# Patient Record
Sex: Male | Born: 1990 | Race: Black or African American | Hispanic: No | Marital: Single | Smoking: Current every day smoker
Health system: Southern US, Community
[De-identification: ages and names within clinical notes are randomized; demographics above are authoritative.]

---

## 2019-07-28 ENCOUNTER — Ambulatory Visit: Admit: 2019-07-28 | Payer: MEDICAID | Attending: Orthopedic Surgery | Primary: Family Medicine

## 2019-07-28 ENCOUNTER — Inpatient Hospital Stay: Admit: 2019-07-28 | Discharge: 2019-07-28 | Payer: MEDICAID | Primary: Family Medicine

## 2019-07-28 ENCOUNTER — Encounter: Admit: 2019-07-28 | Payer: PRIVATE HEALTH INSURANCE | Attending: Orthopedic Surgery | Primary: Family Medicine

## 2019-07-28 DIAGNOSIS — M25511 Pain in right shoulder: Secondary | ICD-10-CM

## 2019-07-28 NOTE — Progress Notes
Patient left after XR done without being seen. Will have scheduling contact patient to offer visit.Knox Saliva, MSN, APRN, FNP-BCDepartment of Orthopaedics & Rehabilitation - Sports MedicineY[facility]

## 2019-08-01 ENCOUNTER — Encounter: Admit: 2019-08-01 | Payer: PRIVATE HEALTH INSURANCE | Primary: Family Medicine

## 2019-08-02 ENCOUNTER — Encounter: Admit: 2019-08-02 | Payer: MEDICAID | Attending: Orthopedic Surgery | Primary: Family Medicine

## 2019-08-02 ENCOUNTER — Encounter: Admit: 2019-08-02 | Payer: PRIVATE HEALTH INSURANCE | Attending: Orthopedic Surgery | Primary: Family Medicine

## 2019-08-02 DIAGNOSIS — M25511 Pain in right shoulder: Secondary | ICD-10-CM

## 2019-08-02 DIAGNOSIS — Z8669 Personal history of other diseases of the nervous system and sense organs: Secondary | ICD-10-CM

## 2019-08-02 MED ORDER — BUTALBITAL-ACETAMINOPHEN-CAFFEINE 50 MG-300 MG-40 MG CAPSULE
50-300-40 mg | Status: AC
Start: 2019-08-02 — End: ?

## 2019-08-02 NOTE — Progress Notes
THIS VISIT WAS CONDUCTED VIA VIDEO VISIT DURING THE COVID-19 PANDEMICDate of Service: 3/2/2021Patient identity confirmed: YesPatient verbally consented to a Telehealth video visit: YesVIDEO TELEHEALTH VISIT VIDEO TELEHEALTH VISIT: This clinician is part of the telehealth program and is conducting this visit at a site within our enrolled clinical location. For this visit the clinician and patient were present via interactive audio & video telecommunications system that permits real-time communications. Patient Location: [place] clinician is appropriately licensed in the above state to provide care for this visit. Other individuals present during the telehealth encounter and their role/relation: None Total time spent in medical video consultation (required if billing based on time): 18 minutes The visit type for this patient required modifications due to the COVID-19 outbreak.Chief complaint/reason for visit: Right shoulder pain since Summer 2020.History and recent changes/symptoms or treatments: Karl Mendoza (RHD) states that he's had two injuries that have caused right shoulder pain. He states that in Sping/Summer 2020 he was playing basketball when he reached with the right arm and it felt like the shoulder stretched. He states that this caused mild soreness to the right shoulder, which improved with use of 800 mg Ibuprofen for a few days.In August/September 2020 he went to change a tire and the jack slipped and the tire fell on his shoulder and caused immediate pain. He states that since this injury, he's had significant pain to the shoulder when reaching overhead, behind the back, and when attempting to lift anything with the RUE. He feels that his RUE is weak. He states that he even gets occasional swelling to the RUE and into his right hand. He has taken 800 mg Ibuprofen PRN with minimal benefit. Karl Mendoza did have an MRI at [facility], which we do no currently have access to.Medications (90-Day Prescriptions given when Indicated) Patient's Medications New Prescriptions  No medications on file Previous Medications  BUTALBITAL-ACETAMINOPHEN-CAFFEINE 50-300-40 MG CAP    PLEASE SEE ATTACHED FOR DETAILED DIRECTIONS Modified Medications  No medications on file Discontinued Medications  No medications on file Imaging: X-ray imaging from 07/28/2019 of the right shoulder demonstrates no evidence of fracture, dislocation, or degenerative changes.Assessment and Plan: Karl Mendoza is a 29 y.o. male with pain/weakness to the right shoulder.I discussed with Karl Mendoza that I am going to attempt to get the MRI report and imaging sent over from [facility]. Once I do, I will call Karl Mendoza to discuss the results and the next steps in his plan of care.Karl Mendoza is in agreement with this plan. All of his questions were answered.Recommended Date of Follow-up: Follow-up pending MRI results.I have counseled about COVID19 ? specifically advising on social distancing, hand washing, staying home when sick. I gave them the [facility] hotline number if they have questions: [phone]-ASK-YNHH [phone] have reminded them of our office number should they need a more urgent appointment. Electronically Signed by Francena Hanly, APRN, August 02, 2019

## 2019-08-02 NOTE — Progress Notes
The following is the Review of systems done at intake and attached to this is the attending physician's documentation.Review of Systems Constitutional: Negative.  HENT: Negative.  Respiratory: Negative.  Cardiovascular: Negative.  Gastrointestinal: Negative.  Endocrine: Negative.  Genitourinary: Negative.  Musculoskeletal:      RT shoulder painRHD Allergic/Immunologic: Negative.  Hematological: Negative.  Psychiatric/Behavioral: Negative.

## 2019-08-04 ENCOUNTER — Telehealth: Admit: 2019-08-04 | Payer: PRIVATE HEALTH INSURANCE | Attending: Orthopedic Surgery | Primary: Family Medicine

## 2019-08-04 NOTE — Telephone Encounter
Called patient to inform him that I've called [facility] multiple times to request MRI and report, have been told it will be sent/faxed, and have not received it. Informed patient I will call again tomorrow. Patient verbalized understanding. All of her questions were answered.Knox Saliva, MSN, APRN, FNP-BCDepartment of Orthopaedics & Rehabilitation - [facility] MedicineY[facility]

## 2019-08-05 ENCOUNTER — Encounter: Admit: 2019-08-05 | Payer: PRIVATE HEALTH INSURANCE | Attending: Orthopedic Surgery | Primary: Family Medicine

## 2019-08-09 ENCOUNTER — Encounter: Admit: 2019-08-09 | Payer: PRIVATE HEALTH INSURANCE | Attending: Orthopedic Surgery | Primary: Family Medicine

## 2019-08-09 ENCOUNTER — Telehealth: Admit: 2019-08-09 | Payer: PRIVATE HEALTH INSURANCE | Attending: Orthopedic Surgery | Primary: Family Medicine

## 2019-08-09 MED ORDER — MELOXICAM 15 MG TABLET
15 mg | ORAL_TABLET | Freq: Every day | ORAL | 1 refills | Status: AC
Start: 2019-08-09 — End: ?

## 2019-08-09 NOTE — Telephone Encounter
Called patient and discussed MRI results. Informed patient that he does have a small rotator cuff tear, but it doesn't require surgery. Recommended 2 weeks of Meloxicam or CSI for pain in conjunction with PT. Patient states he did PT previously and it didn't help. Discussed with patient that PT may not have helped because of the amount of pain he was having and if we address that pain now PT could be much more beneficial. Patient would like to try Meloxicam with no PT at this time. Informed patient that prescription was sent to pharmacy and that patient is to take 1 tablet daily with food for 2 weeks. Informed patient to call or send MyChart message after 2 weeks of Meloxicam if he continues to have symptoms. Patient verbalized understanding. All of his questions were answered.Knox Saliva, MSN, APRN, FNP-BCDepartment of Orthopaedics & Rehabilitation - [facility] MedicineY[facility]

## 2019-09-04 ENCOUNTER — Encounter: Admit: 2019-09-04 | Payer: PRIVATE HEALTH INSURANCE | Attending: Family Medicine | Primary: Family Medicine

## 2020-10-07 ENCOUNTER — Encounter (HOSPITAL_COMMUNITY): Payer: Self-pay | Admitting: Emergency Medicine

## 2020-10-07 ENCOUNTER — Emergency Department (HOSPITAL_COMMUNITY): Payer: Self-pay

## 2020-10-07 ENCOUNTER — Emergency Department (HOSPITAL_COMMUNITY)
Admission: EM | Admit: 2020-10-07 | Discharge: 2020-10-07 | Disposition: A | Discharge status: Home / Self Care | Payer: Self-pay | Attending: Emergency Medicine | Admitting: Emergency Medicine

## 2020-10-07 DIAGNOSIS — L03115 Cellulitis of right lower limb: Secondary | ICD-10-CM | POA: Insufficient documentation

## 2020-10-07 DIAGNOSIS — Z23 Encounter for immunization: Secondary | ICD-10-CM | POA: Insufficient documentation

## 2020-10-07 DIAGNOSIS — B353 Tinea pedis: Secondary | ICD-10-CM

## 2020-10-07 MED ORDER — AMOXICILLIN-POT CLAVULANATE 875-125 MG PO TABS: 1.0000 | ORAL_TABLET | Freq: Two times a day (BID) | ORAL | 0 refills | Status: AC

## 2020-10-07 MED ORDER — TETANUS-DIPHTH-ACELL PERTUSSIS 5-2.5-18.5 LF-MCG/0.5 IM SUSY
0.5000 mL | PREFILLED_SYRINGE | Freq: Once | INTRAMUSCULAR | Status: AC
  Administered 2020-10-07: 0.5 mL via INTRAMUSCULAR
  Filled 2020-10-07: qty 0.5

## 2020-10-07 MED ORDER — TERBINAFINE HCL 1 % EX CREA: 1.0000 "application " | TOPICAL_CREAM | Freq: Two times a day (BID) | CUTANEOUS | 0 refills | Status: AC

## 2020-10-07 MED ORDER — AMOXICILLIN-POT CLAVULANATE 875-125 MG PO TABS
1.0000 | ORAL_TABLET | Freq: Once | ORAL | Status: AC
  Administered 2020-10-07: 1 via ORAL
  Filled 2020-10-07: qty 1

## 2020-10-07 NOTE — ED Provider Notes (Signed)
[facility]-EMERGENCY DEPT Provider Note   CSN: [id number] Arrival date & time: 10/07/20  1927     History Chief Complaint  Patient presents with  . Foot Pain    Espiridion Supinski is a 30 y.o. male.  The history is provided by the patient. No language interpreter was used.  Foot Pain     30 year old male who presents for evaluation of right foot pain.  Patient report for the past 2 to 3 days he has had progressive worsening pain about his right foot.  Described pain as a throbbing aching sensation moderate intensity with increased redness and swelling.  Increasing pain with ambulation.  Mildly improved when he soaked in Epsom salt.  No associated injury no fever chills no history of gout.  No history of diabetes.  Patient admits to having history of athlete's foot, currently not receiving treatment.  Unsure last tetanus status.  No past medical history on file.  There are no problems to display for this patient.   History reviewed. No pertinent surgical history.     No family history on file.     Home Medications Prior to Admission medications   Not on File    Allergies    Patient has no allergy information on record.  Review of Systems   Review of Systems  Constitutional: Negative for fever.  Musculoskeletal: Positive for joint swelling.  Skin: Negative for wound.    Physical Exam Updated Vital Signs BP (!) 135/92   Pulse 91   Temp 98.8 F (37.1 C) (Oral)   Resp 18   SpO2 98%   Physical Exam Vitals and nursing note reviewed.  Constitutional:      General: He is not in acute distress.    Appearance: He is well-developed.  HENT:     Head: Atraumatic.  Eyes:     Conjunctiva/sclera: Conjunctivae normal.  Musculoskeletal:        General: Tenderness (Right foot: Distal dorsum of foot is moderately erythematous, edematous, with pain to third toe.  Evidence of candidal infection in between the webspace.  Intact PT pulse) present.      Cervical back: Neck supple.  Skin:    Findings: No rash.  Neurological:     Mental Status: He is alert.     ED Results / Procedures / Treatments   Labs (all labs ordered are listed, but only abnormal results are displayed) Labs Reviewed - No data to display  EKG None  Radiology DG Foot Complete Right  Result Date: 10/07/2020 CLINICAL DATA:  Foot pain EXAM: RIGHT FOOT COMPLETE - 3+ VIEW COMPARISON:  None. FINDINGS: There is no evidence of fracture or dislocation. There is no evidence of arthropathy or other focal bone abnormality. Soft tissues are unremarkable. IMPRESSION: Negative. Electronically Signed   By: Deatra Robinson M.D.   On: 10/07/2020 20:08    Procedures Procedures   Medications Ordered in ED Medications  Tdap (BOOSTRIX) injection 0.5 mL (has no administration in time range)  amoxicillin-clavulanate (AUGMENTIN) 875-125 MG per tablet 1 tablet (has no administration in time range)    ED Course  I have reviewed the triage vital signs and the nursing notes.  Pertinent labs & imaging results that were available during my care of the patient were reviewed by me and considered in my medical decision making (see chart for details).    MDM Rules/Calculators/A&P  BP (!) 135/92   Pulse 91   Temp 98.8 F (37.1 C) (Oral)   Resp 18   SpO2 98%   Final Clinical Impression(s) / ED Diagnoses Final diagnoses:  Cellulitis of right foot    Rx / DC Orders ED Discharge Orders    None     7:48 PM Patient here with atraumatic right foot pain swelling and redness suggestive of cellulitis involving the foot.  He does have some macerated skin between his webspace of the toe from candidal infection likely contributing to his foot infection.  He report having toe fractures in the past therefore will obtain x-ray to rule out occult fracture.  Doubt DVT.  8:23 PM Xray R foot unremarkable.  Will treat with augmentin abx.  No obvious evidence of abscess at  this time.  Patient also has evidence of tenia pedis.  Will prescribe Lamisil as treatment    Fayrene Helper, PA-C 10/07/20 2052    Terald Sleeper, MD 10/07/20 [phone]

## 2020-10-07 NOTE — ED Triage Notes (Signed)
Patient reports right foot pain x3 days without injury. Reports pain worsens with movement.

## 2020-10-07 NOTE — Discharge Instructions (Addendum)
You have been diagnosed with infection of your right foot.  X-ray did not show any broken bone.  Please take antibiotic as prescribed.  Take over-the-counter Tylenol or ibuprofen as needed for pain.  There is evidence of foot fungus, apply Lamisil cream in between the webspace of your toes twice daily for the next 2 weeks.  Return if your condition worsen or if you have any concern.

## 2020-10-10 ENCOUNTER — Emergency Department (HOSPITAL_COMMUNITY)
Admission: EM | Admit: 2020-10-10 | Discharge: 2020-10-11 | Disposition: A | Discharge status: Home / Self Care | Payer: Medicaid Other | Attending: Emergency Medicine | Admitting: Emergency Medicine

## 2020-10-10 ENCOUNTER — Emergency Department (HOSPITAL_COMMUNITY): Payer: Medicaid Other

## 2020-10-10 ENCOUNTER — Encounter (HOSPITAL_COMMUNITY): Payer: Self-pay

## 2020-10-10 ENCOUNTER — Other Ambulatory Visit: Payer: Self-pay

## 2020-10-10 DIAGNOSIS — M79671 Pain in right foot: Secondary | ICD-10-CM | POA: Insufficient documentation

## 2020-10-10 LAB — CBC
HCT: 41.1 % (ref 39.0–52.0)
Hemoglobin: 14.1 g/dL (ref 13.0–17.0)
MCH: 30.7 pg (ref 26.0–34.0)
MCHC: 34.3 g/dL (ref 30.0–36.0)
MCV: 89.5 fL (ref 80.0–100.0)
Platelets: 263 10*3/uL (ref 150–400)
RBC: 4.59 MIL/uL (ref 4.22–5.81)
RDW: 14.1 % (ref 11.5–15.5)
WBC: 11.5 10*3/uL — ABNORMAL HIGH (ref 4.0–10.5)
nRBC: 0 % (ref 0.0–0.2)

## 2020-10-10 LAB — BASIC METABOLIC PANEL
Anion gap: 6 (ref 5–15)
BUN: 19 mg/dL (ref 6–20)
CO2: 26 mmol/L (ref 22–32)
Calcium: 9.8 mg/dL (ref 8.9–10.3)
Chloride: 104 mmol/L (ref 98–111)
Creatinine, Ser: 1.09 mg/dL (ref 0.61–1.24)
GFR, Estimated: >60 mL/min (ref >60)
Glucose, Bld: 99 mg/dL (ref 70–99)
Potassium: 3.8 mmol/L (ref 3.5–5.1)
Sodium: 136 mmol/L (ref 135–145)

## 2020-10-10 NOTE — ED Triage Notes (Signed)
Pt reports on going right foot pain since 5/5. Says he was here a few days ago and given cream for foot.

## 2020-10-10 NOTE — Discharge Instructions (Signed)
No obvious abnormality was found on your lab work-up or your CAT scan of your foot. Please keep foot elevated and avoid bearing weight. Please call Dr. Greig Right office tomorrow for recheck.

## 2020-10-10 NOTE — ED Provider Notes (Signed)
[facility]-EMERGENCY DEPT Provider Note   CSN: [id number] Arrival date & time: 10/10/20  2107     History Chief Complaint  Patient presents with  . Foot Pain    Paul Fischer is a 30 y.o. male.  HPI   30 year old male history of tinea seen here 3 days ago with some pain and swelling on the dorsal aspect of the right foot.  At that time he was thought to have cellulitis.  He was given a prescription for Augmentin.  He states he has been taking the Augmentin and was also given some antifungal cream.  He states the antifungal cream just worsens the situation.  He is having ongoing pain and swelling.  He has had no spreading redness, warmth, injury, or fever.  He has some chronic skin changes that he states have been present since he was in high school.  Reports no change in his amount of walking, standing, or shoes.  He is not a diabetic.  History reviewed. No pertinent past medical history.  There are no problems to display for this patient.   History reviewed. No pertinent surgical history.     No family history on file.  Social History   Tobacco Use  . Smoking status: Never Smoker  . Smokeless tobacco: Never Used  Substance Use Topics  . Alcohol use: Never  . Drug use: Never    Home Medications Prior to Admission medications   Medication Sig Start Date End Date Taking? Authorizing Provider  amoxicillin-clavulanate (AUGMENTIN) 875-125 MG tablet Take 1 tablet by mouth 2 (two) times daily. One po bid x 7 days 10/07/20   Fayrene Helper, PA-C  terbinafine (LAMISIL AT) 1 % cream Apply 1 application topically 2 (two) times daily. 10/07/20   Fayrene Helper, PA-C    Allergies    Patient has no known allergies.  Review of Systems   Review of Systems  All other systems reviewed and are negative.   Physical Exam Updated Vital Signs BP 116/63 (BP Location: Left Arm)   Pulse 72   Temp 98.1 F (36.7 C)   Resp 18   Ht 1.854 m (6\' 1" )   Wt 81.6 kg   SpO2  100%   BMI 23.75 kg/m   Physical Exam Vitals and nursing note reviewed.  Constitutional:      Appearance: He is well-developed.  HENT:     Head: Normocephalic and atraumatic.     Right Ear: External ear normal.     Left Ear: External ear normal.     Nose: Nose normal.  Neck:     Trachea: No tracheal deviation.  Pulmonary:     Effort: Pulmonary effort is normal.  Musculoskeletal:        General: Normal range of motion.     Comments: Bilateral feet with some chronic skin changes consistent with previous history of tinea pedis On the right foot there is some increased mild swelling on the dorsal aspect.  There is tenderness palpation between the second and fourth metatarsals.  There is no obvious fluctuance The plantar aspect of the foot is normal Pulses are intact and normal There is no obvious deformity or external signs of trauma  Skin:    General: Skin is warm and dry.  Neurological:     Mental Status: He is alert and oriented to person, place, and time.  Psychiatric:        Behavior: Behavior normal.     ED Results / Procedures / Treatments  Labs (all labs ordered are listed, but only abnormal results are displayed) Labs Reviewed  CBC - Abnormal; Notable for the following components:      Result Value   WBC 11.5 (*)    All other components within normal limits  BASIC METABOLIC PANEL    EKG None  Radiology No results found.  Procedures Procedures   Medications Ordered in ED Medications - No data to display  ED Course  I have reviewed the triage vital signs and the nursing notes.  Pertinent labs & imaging results that were available during my care of the patient were reviewed by me and considered in my medical decision making (see chart for details).    MDM Rules/Calculators/A&P                          Patient presents today with right foot pain.  Labs obtained with white blood cell count of 11,500 and remained within normal limits Patient had plain  films done on first visit.  Today he had CT obtained.  There is no evidence of acute abnormality noted on CT scan-specifically no evidence of fracture, osteo-, or abscess. Plan soft shoe and will refer to Ortho. Final Clinical Impression(s) / ED Diagnoses Final diagnoses:  Right foot pain    Rx / DC Orders ED Discharge Orders    None       Margarita Grizzle, MD 10/10/20 2309

## 2020-11-27 ENCOUNTER — Emergency Department (HOSPITAL_COMMUNITY)
Admission: EM | Admit: 2020-11-27 | Discharge: 2020-11-27 | Disposition: A | Discharge status: Home / Self Care | Payer: Self-pay | Attending: Emergency Medicine | Admitting: Emergency Medicine

## 2020-11-27 ENCOUNTER — Encounter (HOSPITAL_COMMUNITY): Payer: Self-pay

## 2020-11-27 ENCOUNTER — Emergency Department (HOSPITAL_COMMUNITY): Payer: Self-pay

## 2020-11-27 DIAGNOSIS — M546 Pain in thoracic spine: Secondary | ICD-10-CM | POA: Insufficient documentation

## 2020-11-27 DIAGNOSIS — M5431 Sciatica, right side: Secondary | ICD-10-CM | POA: Insufficient documentation

## 2020-11-27 DIAGNOSIS — M5432 Sciatica, left side: Secondary | ICD-10-CM | POA: Insufficient documentation

## 2020-11-27 MED ORDER — KETOROLAC TROMETHAMINE 60 MG/2ML IM SOLN
60.0000 mg | Freq: Once | INTRAMUSCULAR | Status: AC
  Administered 2020-11-27: 60 mg via INTRAMUSCULAR
  Filled 2020-11-27: qty 2

## 2020-11-27 MED ORDER — METHYLPREDNISOLONE 4 MG PO TBPK: ORAL_TABLET | ORAL | 0 refills | Status: DC

## 2020-11-27 MED ORDER — METHYLPREDNISOLONE 4 MG PO TBPK: ORAL_TABLET | ORAL | 0 refills | Status: AC

## 2020-11-27 MED ORDER — LIDOCAINE 5 % EX PTCH
1.0000 | MEDICATED_PATCH | CUTANEOUS | Status: DC
  Administered 2020-11-27: 1 via TRANSDERMAL
  Filled 2020-11-27: qty 1

## 2020-11-27 MED ORDER — IBUPROFEN 800 MG PO TABS: 800.0000 mg | ORAL_TABLET | Freq: Three times a day (TID) | ORAL | 0 refills | Status: DC

## 2020-11-27 MED ORDER — METHOCARBAMOL 500 MG PO TABS: 500.0000 mg | ORAL_TABLET | Freq: Two times a day (BID) | ORAL | 0 refills | Status: AC

## 2020-11-27 MED ORDER — METHOCARBAMOL 500 MG PO TABS: 500.0000 mg | ORAL_TABLET | Freq: Two times a day (BID) | ORAL | 0 refills | Status: DC

## 2020-11-27 NOTE — Discharge Instructions (Addendum)
Your back pain should be treated with medicines such as ibuprofen or aleve and this back pain should get better over the next 2 weeks.  You may also take the muscle relaxer, take at night do not drink or drive the medication.  Please see the handout for some low back stretches as well.  Take the course of steroids for your radiating symptoms.    However if you develop severe or worsening pain, low back pain with fever, numbness, weakness or inability to walk or urinate, you should return to the ER immediately.  Please follow up with your doctor this week for a recheck if still having symptoms.  Low back pain is discomfort in the lower back that may be due to injuries to muscles and ligaments around the spine.  Occasionally, it may be caused by a a problem to a part of the spine called a disc.  The pain may last several days or a week;  However, most patients get completely well in 4 weeks.

## 2020-11-27 NOTE — ED Provider Notes (Signed)
Emergency Medicine Provider Triage Evaluation Note  Paul Fischer , a 30 y.o. male  was evaluated in triage.  Pt complains of right-sided back pain.  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  He states that yesterday he started to feel some tightening in the right side of his back, which was not abnormal for him.  He states that as he was walking today, he coughed, and he had a severe muscle spasm to the right side of his back.  He also states that he feels more radiation into his legs now.  Denies any numbness.  No loss of bowel bladder control.  He has not taken anything for his symptoms.  Denies any IV drug use.  No fevers or chills.  No unintended weight loss.  No urinary symptoms.  Review of Systems  Positive: As above Negative: As above  Physical Exam  BP 128/75 (BP Location: Left Arm)   Pulse 81   Temp 98.2 F (36.8 C) (Oral)   Resp 20   SpO2 100%  Gen:   Awake, no distress   Resp:  Normal effort  MSK:   Moves extremities without difficulty , no midline tenderness to the C, T, L-spine, associated paraspinal right-sided thoracic and lumbar muscular tenderness Other:    Medical Decision Making  Medically screening exam initiated at 12:42 PM.  Appropriate orders placed.  Paul Fischer was informed that the remainder of the evaluation will be completed by another provider, this initial triage assessment does not replace that evaluation, and the importance of remaining in the ED until their evaluation is complete.     Mare Ferrari, PA-C 11/27/20 1244    Milagros Loll, MD 11/28/20 [phone]

## 2020-11-27 NOTE — ED Provider Notes (Signed)
[facility]   CSN: [id number] Arrival date & time: 11/27/20  1226     History Chief Complaint  Patient presents with   Back Pain    Paul Fischer is a 30 y.o. male.  HPI Pt complains of right-sided back pain.  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  He states that yesterday he started to feel some tightening in the right side of his back, which was not abnormal for him.  He states that as he was walking today, he coughed, and he had a severe muscle spasm to the right side of his back.  He also states that he feels more radiation into his legs now.  Denies any numbness.  No loss of bowel bladder control.  He has not taken anything for his symptoms.  Denies any IV drug use.  No fevers or chills.  No unintended weight loss.  No urinary symptoms.    History reviewed. No pertinent past medical history.  There are no problems to display for this patient.   History reviewed. No pertinent surgical history.     History reviewed. No pertinent family history.  Social History   Tobacco Use   Smoking status: Never   Smokeless tobacco: Never  Substance Use Topics   Alcohol use: Never   Drug use: Never    Home Medications Prior to Admission medications   Medication Sig Start Date End Date Taking? Authorizing Provider  amoxicillin-clavulanate (AUGMENTIN) 875-125 MG tablet Take 1 tablet by mouth 2 (two) times daily. One po bid x 7 days 10/07/20   Fayrene Helper, PA-C  ibuprofen (ADVIL) 800 MG tablet Take 1 tablet (800 mg total) by mouth 3 (three) times daily. 11/27/20   Mare Ferrari, PA-C  methocarbamol (ROBAXIN) 500 MG tablet Take 1 tablet (500 mg total) by mouth 2 (two) times daily. 11/27/20   Mare Ferrari, PA-C  methylPREDNISolone (MEDROL DOSEPAK) 4 MG TBPK tablet Take as directed until finished 11/27/20   Trudee Grip A, PA-C  terbinafine (LAMISIL AT) 1 % cream Apply 1 application  topically 2 (two) times daily. 10/07/20   Fayrene Helper, PA-C    Allergies    Patient has no known allergies.  Review of Systems   Review of Systems  Genitourinary:  Negative for dysuria and flank pain.  Musculoskeletal:  Positive for back pain.  Neurological:  Negative for weakness and numbness.   Physical Exam Updated Vital Signs BP 128/75 (BP Location: Left Arm)   Pulse 81   Temp 98.2 F (36.8 C) (Oral)   Resp 20   SpO2 100%   Physical Exam Vitals and nursing note reviewed.  Constitutional:      Appearance: He is well-developed.  HENT:     Head: Normocephalic and atraumatic.  Eyes:     Conjunctiva/sclera: Conjunctivae normal.  Cardiovascular:     Rate and Rhythm: Normal rate and regular rhythm.     Heart sounds: No murmur heard. Pulmonary:     Effort: Pulmonary effort is normal. No respiratory distress.     Breath sounds: Normal breath sounds.  Abdominal:     Palpations: Abdomen is soft.     Tenderness: There is no abdominal tenderness. There is no right CVA tenderness or left CVA tenderness.  Musculoskeletal:     Cervical back: Neck supple.     Comments: Mild midline tenderness of the thoracic spine, but mostly paraspinal musculature in the thoracic and  lumbar spine on the right.  Patient ambulating about the room with no evidence of foot drop.  5/5 strength in upper and lower extremities bilaterally.  No noticeable step-offs or crepitus.  No overlying erythema, warmth.  Skin:    General: Skin is warm and dry.  Neurological:     Mental Status: He is alert.    ED Results / Procedures / Treatments   Labs (all labs ordered are listed, but only abnormal results are displayed) Labs Reviewed - No data to display  EKG None  Radiology DG Thoracic Spine 2 View  Result Date: 11/27/2020 CLINICAL DATA:  Back pain after coughing EXAM: THORACIC SPINE 2 VIEWS COMPARISON:  None. FINDINGS: There is no evidence of thoracic spine fracture. Alignment is normal. No other  significant bone abnormalities are identified. IMPRESSION: Negative. Electronically Signed   By: Marlan Palau M.D.   On: 11/27/2020 14:59   DG Lumbar Spine Complete  Result Date: 11/27/2020 CLINICAL DATA:  Back pain EXAM: LUMBAR SPINE - COMPLETE 4+ VIEW COMPARISON:  None. FINDINGS: Five non rib-bearing lumbar type vertebra. Lumbar alignment within normal limits. Vertebral body heights are maintained. The disc spaces are patent. IMPRESSION: Negative. Electronically Signed   By: Jasmine Pang M.D.   On: 11/27/2020 14:59    Procedures Procedures   Medications Ordered in ED Medications  lidocaine (LIDODERM) 5 % 1 patch (1 patch Transdermal Patch Applied 11/27/20 1404)  ketorolac (TORADOL) injection 60 mg (60 mg Intramuscular Given 11/27/20 1406)    ED Course  I have reviewed the triage vital signs and the nursing notes.  Pertinent labs & imaging results that were available during my care of the patient were reviewed by me and considered in my medical decision making (see chart for details).    MDM Rules/Calculators/A&P                           Normal neurological exam, no evidence of urinary incontinence or retention, pain is consistently reproducible. There is no evidence of AAA or concern for dissection at this time.  Plain films of the thoracic and lumbar spine without abnormality.  Patient can walk but states is painful.  No loss of bowel or bladder control.  No concern for cauda equina.  No fever, night sweats, weight loss, h/o cancer, IVDU.  Pain treated here in the department with adequate improvement. RICE protocol and pain medicine indicated and discussed with patient.  Patient was provided with muscle relaxer, instructed to not drink or drive the medication due to sedating side effects.  We will also prescribe Medrol Dosepak as the patient does have some sciatica type symptoms.  Will refer to [facility] wellness as he is uninsured.  We discussed return precautions.  He voiced  understanding and is agreeable.  Stable for discharge.  Final Clinical Impression(s) / ED Diagnoses Final diagnoses:  Acute right-sided thoracic back pain  Bilateral sciatica    Rx / DC Orders ED Discharge Orders          Ordered    methocarbamol (ROBAXIN) 500 MG tablet  2 times daily,   Status:  Discontinued        11/27/20 1353    methylPREDNISolone (MEDROL DOSEPAK) 4 MG TBPK tablet  Status:  Discontinued        11/27/20 1353    ibuprofen (ADVIL) 800 MG tablet  3 times daily,   Status:  Discontinued        11/27/20  1353    methocarbamol (ROBAXIN) 500 MG tablet  2 times daily        11/27/20 1410    methylPREDNISolone (MEDROL DOSEPAK) 4 MG TBPK tablet        11/27/20 1410    ibuprofen (ADVIL) 800 MG tablet  3 times daily        11/27/20 1410             Leone Brand 11/27/20 1511    Milagros Loll, MD 11/28/20 [phone]

## 2020-11-27 NOTE — ED Triage Notes (Signed)
Pt arrived via POV, c/o back pain. States he always has back pain but today pain worsening and feeling muscle spasms.

## 2021-09-27 IMAGING — CT CT FOOT*R* W/O CM
3 of 4 series · 12 of 33 positions shown, 15 images · non-contrast
Comparison: Xr right foot 10/07/20

CLINICAL DATA: Limping, acute, foot pain, infection suspected.
Elevated white blood cell count. Diagnosed with cellulitis a few
days ago.

EXAM:
CT OF THE RIGHT FOOT WITHOUT CONTRAST
TECHNIQUE: Multidetector CT imaging of the right foot was performed according
to the standard protocol. Multiplanar CT image reconstructions were
also generated.

[Series 4: axial st · axial · 0.58mm/px · z∈[+215,+395]mm · 6 of 119 slices shown, 8 images]
[im 19/119  soft-tissue]
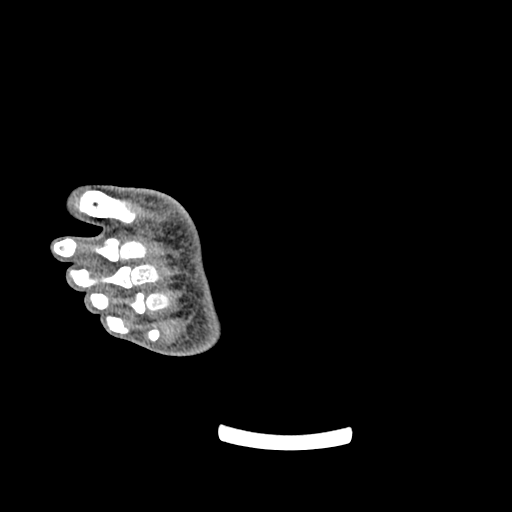
[im 19/119  bone]
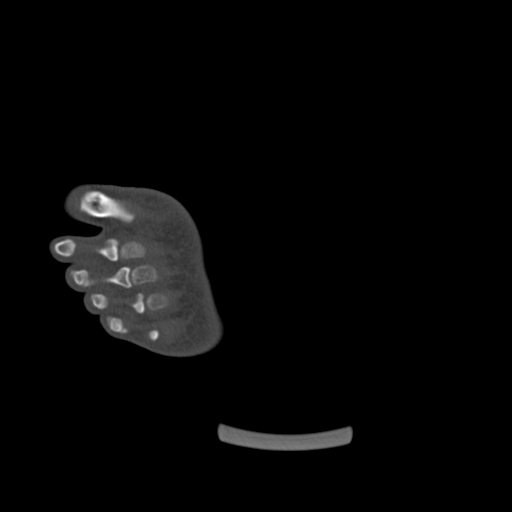
[im 37/119  bone]
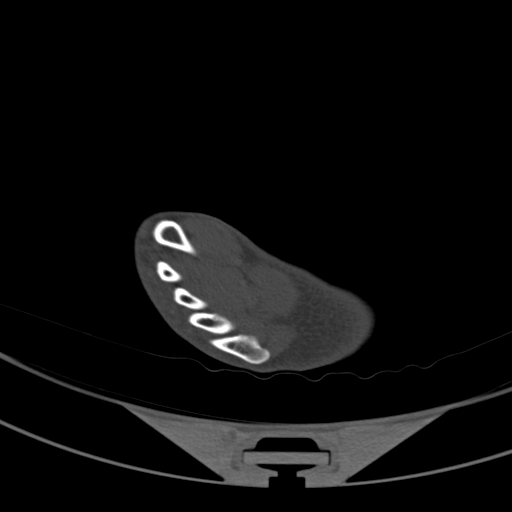
[im 55/119  bone]
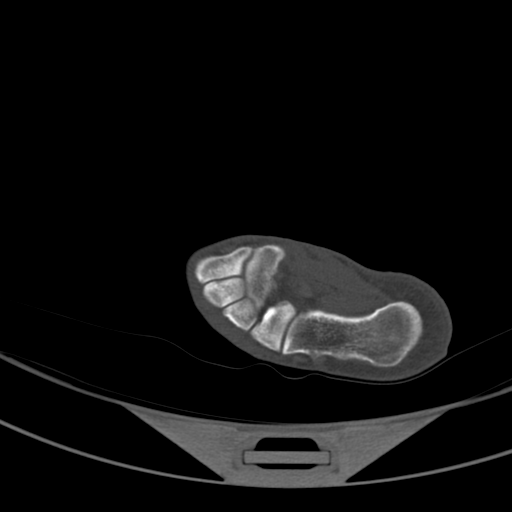
[im 73/119  bone]
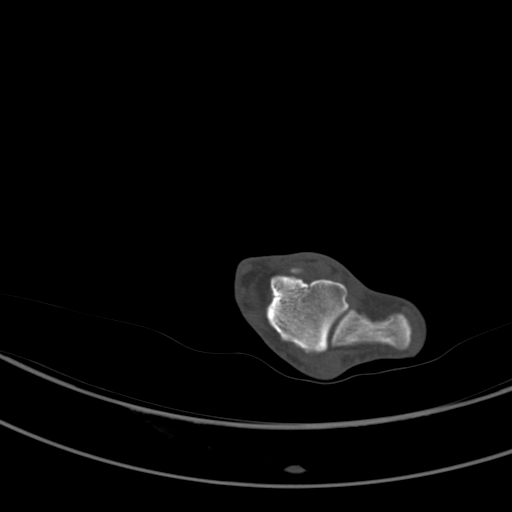
[im 91/119  soft-tissue]
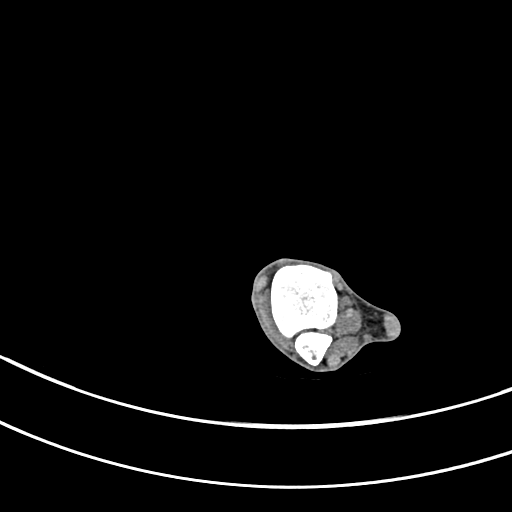
[im 91/119  bone]
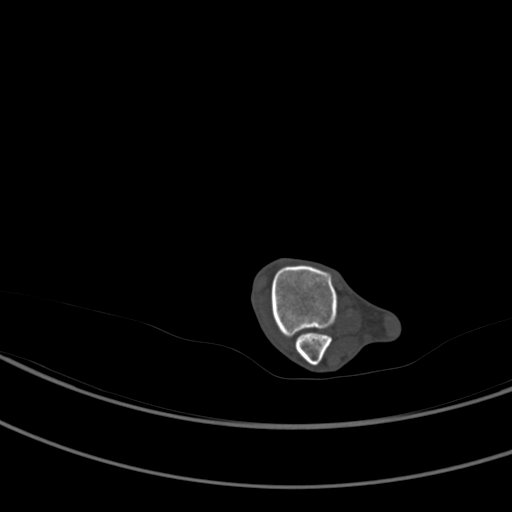
[im 109/119  bone]
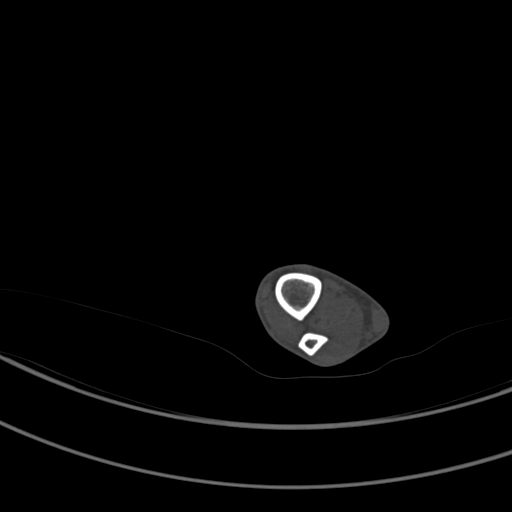

[Series 7: coronal bone · sagittal · 0.31mm/px · 5 of 133 slices shown, 6 images]
[im 45/133  bone]
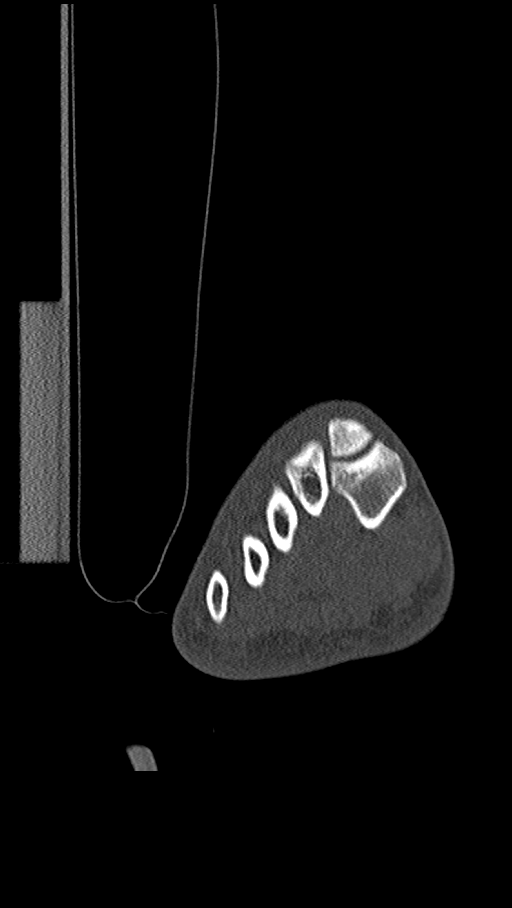
[im 56/133  bone]
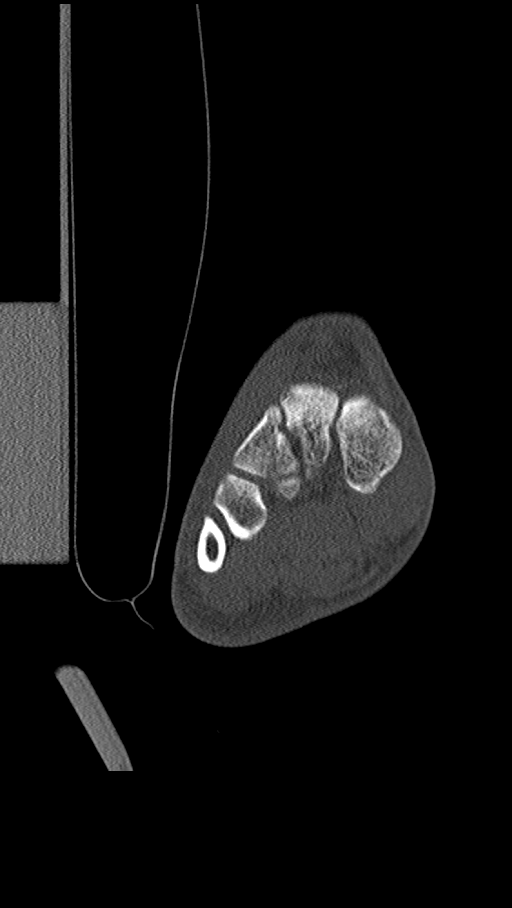
[im 67/133  soft-tissue]
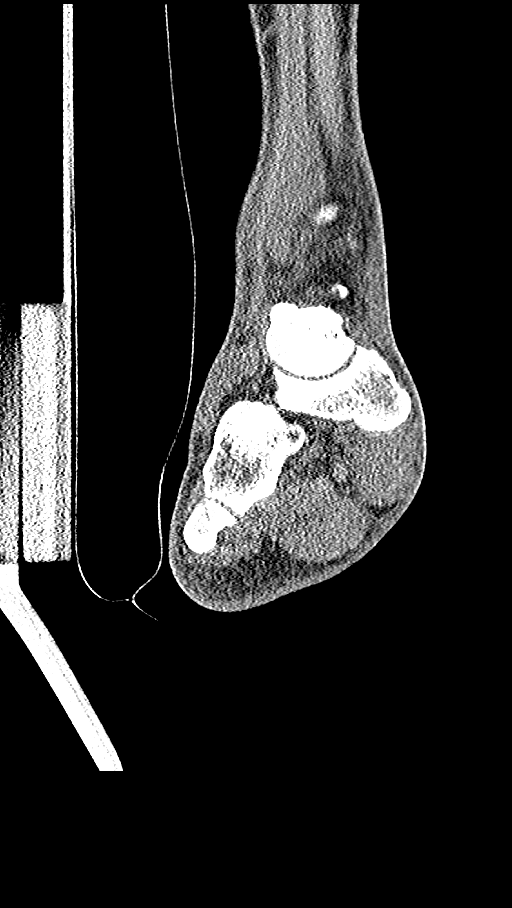
[im 67/133  bone]
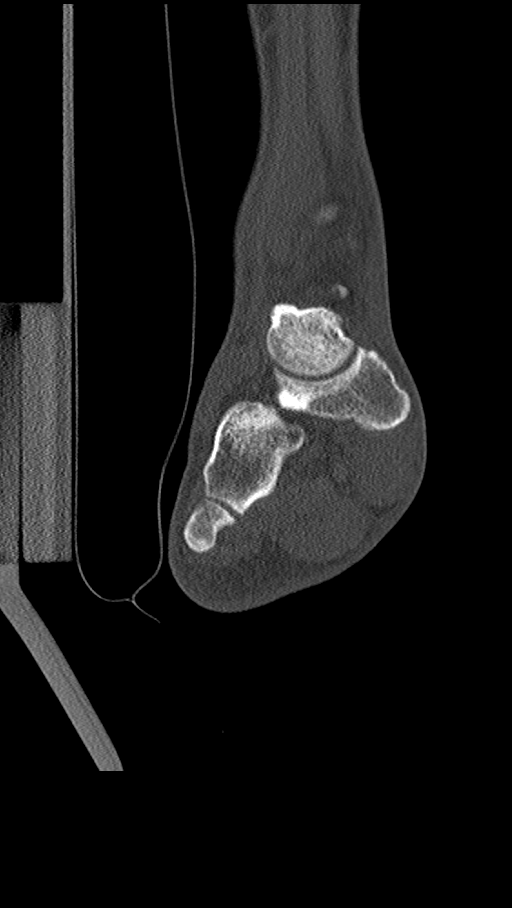
[im 78/133  bone]
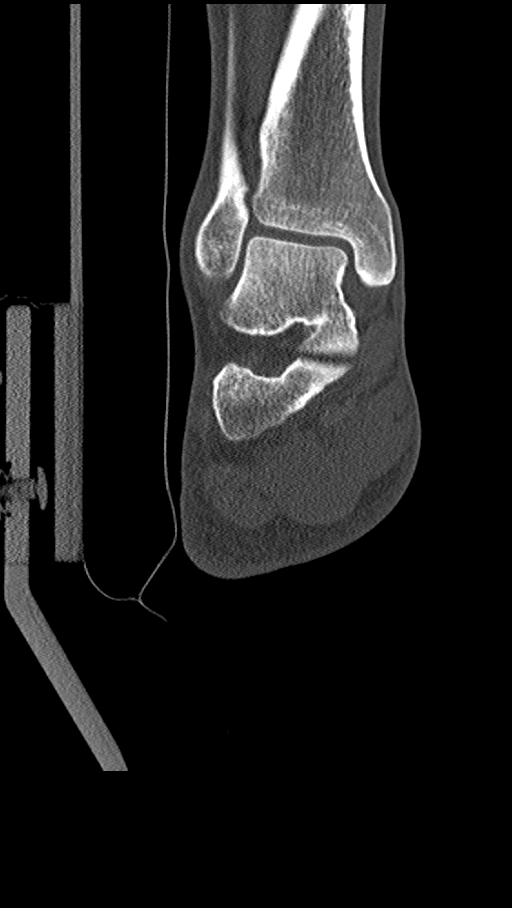
[im 89/133  bone]
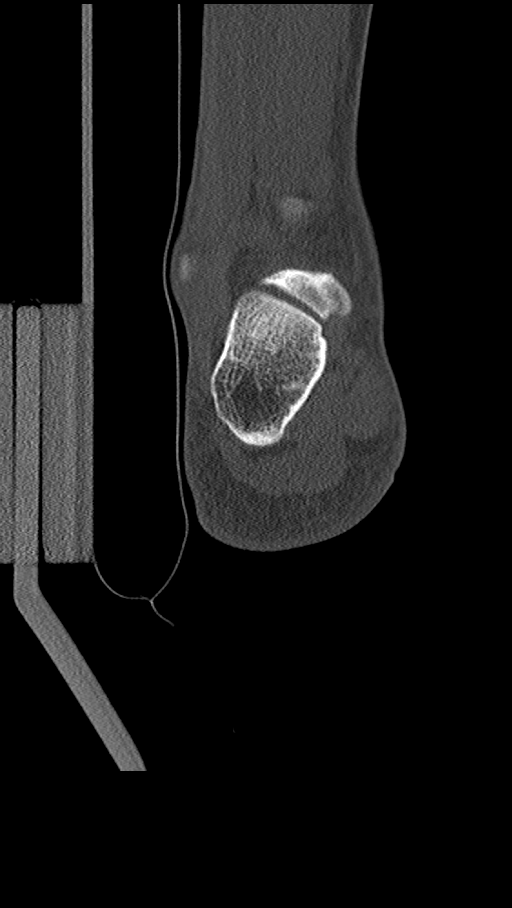

[Series 9: coronal st · coronal · 0.54mm/px · 1 of 80 slices shown]
[im 40/80  bone]
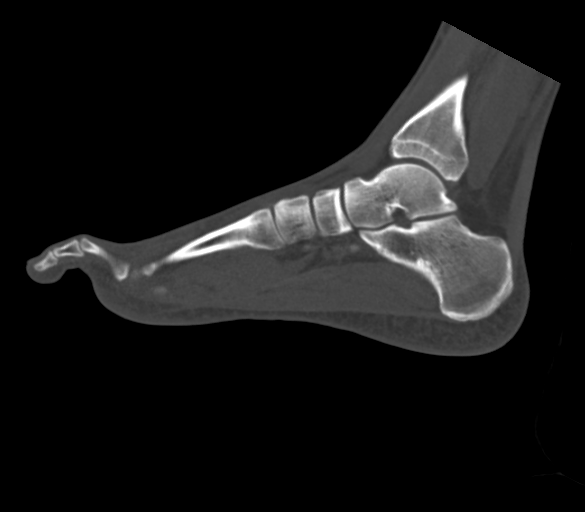

[12 of 33 positions shown; findings below may reference images not displayed]

FINDINGS: Bones/Joint/Cartilage

No cortical erosion or destruction. No acute displaced fracture. No
dislocation of the bones of the foot.

Ligaments

Suboptimally assessed by CT.

Muscles and Tendons

Unremarkable.

Soft tissues

Mild subcutaneus soft tissue edema along the dorsal aspect of the
midfoot. No subcutaneus soft tissue emphysema. No organized fluid
collection. No retained radiopaque foreign body.
IMPRESSION: 1. Mild subcutaneus soft tissue edema along the dorsal aspect of the
midfoot with no CT findings to suggest osteomyelitis of the right
foot. No organized fluid collection. If continued concern for
osteomyelitis, can consider obtaining an MRI (with intravenous
contrast if GFR greater than 30) for a more sensitive evaluation of
osteomyelitis.
2. No acute displaced fracture or dislocation.

These results were called by telephone at the time of interpretation
on 10/10/2020 at [DATE] to provider MUNAWARA ZAMEER , who verbally
acknowledged these results.

## 2022-08-28 ENCOUNTER — Emergency Department (HOSPITAL_COMMUNITY)
Admission: EM | Admit: 2022-08-28 | Discharge: 2022-08-28 | Disposition: A | Discharge status: Home / Self Care | Payer: Medicaid Other | Attending: Emergency Medicine | Admitting: Emergency Medicine

## 2022-08-28 ENCOUNTER — Encounter (HOSPITAL_COMMUNITY): Payer: Self-pay

## 2022-08-28 DIAGNOSIS — R112 Nausea with vomiting, unspecified: Secondary | ICD-10-CM | POA: Insufficient documentation

## 2022-08-28 DIAGNOSIS — R197 Diarrhea, unspecified: Secondary | ICD-10-CM | POA: Diagnosis not present

## 2022-08-28 LAB — CBC WITH DIFFERENTIAL/PLATELET
Abs Immature Granulocytes: 0.01 10*3/uL (ref 0.00–0.07)
Basophils Absolute: 0 10*3/uL (ref 0.0–0.1)
Basophils Relative: 0 %
Eosinophils Absolute: 0.1 10*3/uL (ref 0.0–0.5)
Eosinophils Relative: 1 %
HCT: 39.9 % (ref 39.0–52.0)
Hemoglobin: 14 g/dL (ref 13.0–17.0)
Immature Granulocytes: 0 %
Lymphocytes Relative: 19 %
Lymphs Abs: 1.2 10*3/uL (ref 0.7–4.0)
MCH: 30.6 pg (ref 26.0–34.0)
MCHC: 35.1 g/dL (ref 30.0–36.0)
MCV: 87.1 fL (ref 80.0–100.0)
Monocytes Absolute: 0.6 10*3/uL (ref 0.1–1.0)
Monocytes Relative: 9 %
Neutro Abs: 4.2 10*3/uL (ref 1.7–7.7)
Neutrophils Relative %: 71 %
Platelets: 218 10*3/uL (ref 150–400)
RBC: 4.58 MIL/uL (ref 4.22–5.81)
RDW: 13.8 % (ref 11.5–15.5)
WBC: 6.1 10*3/uL (ref 4.0–10.5)
nRBC: 0 % (ref 0.0–0.2)

## 2022-08-28 LAB — COMPREHENSIVE METABOLIC PANEL
ALT: 25 U/L (ref 0–44)
AST: 29 U/L (ref 15–41)
Albumin: 4.2 g/dL (ref 3.5–5.0)
Alkaline Phosphatase: 56 U/L (ref 38–126)
Anion gap: 8 (ref 5–15)
BUN: 19 mg/dL (ref 6–20)
CO2: 23 mmol/L (ref 22–32)
Calcium: 9 mg/dL (ref 8.9–10.3)
Chloride: 104 mmol/L (ref 98–111)
Creatinine, Ser: 1.04 mg/dL (ref 0.61–1.24)
GFR, Estimated: >60 mL/min (ref >60)
Glucose, Bld: 100 mg/dL — ABNORMAL HIGH (ref 70–99)
Potassium: 3.7 mmol/L (ref 3.5–5.1)
Sodium: 135 mmol/L (ref 135–145)
Total Bilirubin: 0.8 mg/dL (ref 0.3–1.2)
Total Protein: 6.6 g/dL (ref 6.5–8.1)

## 2022-08-28 LAB — LIPASE, BLOOD: Lipase: 29 U/L (ref 11–51)

## 2022-08-28 MED ORDER — ONDANSETRON 4 MG PO TBDP: 4.0000 mg | ORAL_TABLET | Freq: Three times a day (TID) | ORAL | 0 refills | Status: AC | PRN

## 2022-08-28 MED ORDER — SODIUM CHLORIDE 0.9 % IV BOLUS
1000.0000 mL | Freq: Once | INTRAVENOUS | Status: AC
  Administered 2022-08-28: 1000 mL via INTRAVENOUS

## 2022-08-28 MED ORDER — ONDANSETRON HCL 4 MG/2ML IJ SOLN
4.0000 mg | Freq: Once | INTRAMUSCULAR | Status: AC
  Administered 2022-08-28: 4 mg via INTRAVENOUS
  Filled 2022-08-28: qty 2

## 2022-08-28 NOTE — Discharge Instructions (Addendum)
Take the prescribed medication as directed.  Try to push oral fluids, gentle diet for now and advance as tolerated.  See attached for recommended food choices. Follow-up with your primary care doctor. Return to the ED for new or worsening symptoms.

## 2022-08-28 NOTE — ED Provider Notes (Signed)
[facility] Provider Note   CSN: [id number] Arrival date & time: 08/28/22  [id number]     History  Chief Complaint  Patient presents with   Abdominal Pain   Emesis    Paul Fischer is a 32 y.o. male.  The history is provided by the patient and medical records.  Abdominal Pain Associated symptoms: vomiting   Emesis Associated symptoms: abdominal pain    32 y.o. M here with N/V/D after eating [employer] around lunch yesterday.  States he ate a burger and some nuggets which tasted fine but shortly afterwards he started having symptoms.  Denies blood in stool or emesis.  No fevers.  No sick contacts with similar.  No meds PTA.  He initially was able to keep down small sips of water but symptoms since worsened.  Home Medications Prior to Admission medications   Medication Sig Start Date End Date Taking? Authorizing Provider  ondansetron (ZOFRAN-ODT) 4 MG disintegrating tablet Take 1 tablet (4 mg total) by mouth every 8 (eight) hours as needed for nausea. 08/28/22  Yes Larene Pickett, PA-C  amoxicillin-clavulanate (AUGMENTIN) 875-125 MG tablet Take 1 tablet by mouth 2 (two) times daily. One po bid x 7 days Patient not taking: Reported on 08/28/2022 10/07/20   Domenic Moras, PA-C  ibuprofen (ADVIL) 800 MG tablet Take 1 tablet (800 mg total) by mouth 3 (three) times daily. Patient not taking: Reported on 08/28/2022 11/27/20   Garald Balding, PA-C  methocarbamol (ROBAXIN) 500 MG tablet Take 1 tablet (500 mg total) by mouth 2 (two) times daily. Patient not taking: Reported on 08/28/2022 11/27/20   Garald Balding, PA-C  methylPREDNISolone (MEDROL DOSEPAK) 4 MG TBPK tablet Take as directed until finished Patient not taking: Reported on 08/28/2022 11/27/20   Garald Balding, PA-C  terbinafine (LAMISIL AT) 1 % cream Apply 1 application topically 2 (two) times daily. Patient not taking: Reported on 08/28/2022 10/07/20   Domenic Moras, PA-C      Allergies    Patient  has no known allergies.    Review of Systems   Review of Systems  Gastrointestinal:  Positive for abdominal pain and vomiting.  All other systems reviewed and are negative.   Physical Exam Updated Vital Signs BP 108/75   Pulse 86   Temp 98.4 F (36.9 C) (Oral)   Resp 18   Ht 6\' 1"  (1.854 m)   Wt 78.9 kg   SpO2 99%   BMI 22.96 kg/m   Physical Exam Vitals and nursing note reviewed.  Constitutional:      Appearance: He is well-developed.  HENT:     Head: Normocephalic and atraumatic.  Eyes:     Conjunctiva/sclera: Conjunctivae normal.     Pupils: Pupils are equal, round, and reactive to light.  Cardiovascular:     Rate and Rhythm: Normal rate and regular rhythm.     Heart sounds: Normal heart sounds.  Pulmonary:     Effort: Pulmonary effort is normal.     Breath sounds: Normal breath sounds.  Abdominal:     General: Bowel sounds are normal.     Palpations: Abdomen is soft.     Tenderness: There is no abdominal tenderness. There is no guarding or rebound.  Musculoskeletal:        General: Normal range of motion.     Cervical back: Normal range of motion.  Skin:    General: Skin is warm and dry.  Neurological:  Mental Status: He is alert and oriented to person, place, and time.     ED Results / Procedures / Treatments   Labs (all labs ordered are listed, but only abnormal results are displayed) Labs Reviewed  COMPREHENSIVE METABOLIC PANEL - Abnormal; Notable for the following components:      Result Value   Glucose, Bld 100 (*)    All other components within normal limits  CBC WITH DIFFERENTIAL/PLATELET  LIPASE, BLOOD    EKG None  Radiology No results found.  Procedures Procedures    Medications Ordered in ED Medications  ondansetron (ZOFRAN) injection 4 mg (4 mg Intravenous Given 08/28/22 0401)  sodium chloride 0.9 % bolus 1,000 mL (0 mLs Intravenous Stopped 08/28/22 0535)    ED Course/ Medical Decision Making/ A&P                              Medical Decision Making Amount and/or Complexity of Data Reviewed Labs: ordered. ECG/medicine tests: ordered and independent interpretation performed.  Risk Prescription drug management.   32 y.o. M here with N/V/D after eating [employer] yesterday at lunch.  Thinks he may have food poisoning.  Afebrile, non-toxic in appearance.  VSS.  Abdomen soft, non-tender.  Labs as above-- no leukocytosis, no electrolyte derangement.  Lipase is normal.  He is feeling better here after IV fluids and Zofran.  Able to tolerate sips of water without recurrent emesis.  Feel he is stable for discharge home with symptomatic care.  Rx zofran.  Encouraged to push oral fluids, bland diet and advance as tolerated.  Follow-up with PCP.  Return here for new concerns.   Final Clinical Impression(s) / ED Diagnoses Final diagnoses:  Nausea vomiting and diarrhea    Rx / DC Orders ED Discharge Orders          Ordered    ondansetron (ZOFRAN-ODT) 4 MG disintegrating tablet  Every 8 hours PRN        08/28/22 0553              Larene Pickett, PA-C [id number]    [name], MD [id number] [phone]

## 2022-08-28 NOTE — ED Triage Notes (Signed)
Pt reports ate [employer] yesterday at noon time, then started to have abd pain with N/V/D. Pt reports think he may have food poisoning. VSS, NAD noted, pt A&O x4.

## 2023-04-07 ENCOUNTER — Other Ambulatory Visit: Payer: Self-pay

## 2023-04-07 ENCOUNTER — Emergency Department (HOSPITAL_COMMUNITY): Payer: BC Managed Care – PPO

## 2023-04-07 ENCOUNTER — Emergency Department (HOSPITAL_COMMUNITY)
Admission: EM | Admit: 2023-04-07 | Discharge: 2023-04-07 | Disposition: A | Discharge status: Home / Self Care | Payer: BC Managed Care – PPO | Attending: Emergency Medicine | Admitting: Emergency Medicine

## 2023-04-07 ENCOUNTER — Encounter (HOSPITAL_COMMUNITY): Payer: Self-pay

## 2023-04-07 DIAGNOSIS — Y990 Civilian activity done for income or pay: Secondary | ICD-10-CM | POA: Insufficient documentation

## 2023-04-07 DIAGNOSIS — M25512 Pain in left shoulder: Secondary | ICD-10-CM | POA: Diagnosis present

## 2023-04-07 DIAGNOSIS — S46912A Strain of unspecified muscle, fascia and tendon at shoulder and upper arm level, left arm, initial encounter: Secondary | ICD-10-CM | POA: Diagnosis not present

## 2023-04-07 DIAGNOSIS — X500XXA Overexertion from strenuous movement or load, initial encounter: Secondary | ICD-10-CM | POA: Insufficient documentation

## 2023-04-07 MED ORDER — IBUPROFEN 800 MG PO TABS: 800.0000 mg | ORAL_TABLET | Freq: Three times a day (TID) | ORAL | 0 refills | Status: AC | PRN

## 2023-04-07 MED ORDER — HYDROCODONE-ACETAMINOPHEN 5-325 MG PO TABS
1.0000 | ORAL_TABLET | Freq: Once | ORAL | Status: AC
  Administered 2023-04-07: 1 via ORAL
  Filled 2023-04-07: qty 1

## 2023-04-07 MED ORDER — KETOROLAC TROMETHAMINE 60 MG/2ML IM SOLN
60.0000 mg | Freq: Once | INTRAMUSCULAR | Status: AC
  Administered 2023-04-07: 60 mg via INTRAMUSCULAR
  Filled 2023-04-07: qty 2

## 2023-04-07 NOTE — ED Provider Notes (Signed)
**Note Paul-Identified via Obfuscation** [place] EMERGENCY DEPARTMENT AT [facility] Provider Note   CSN: [id number] Arrival date & time: 04/07/23  0210     History  Chief Complaint  Patient presents with   Shoulder Pain    Paul Fischer is a 32 y.o. male.  The history is provided by the patient.  Shoulder Pain Paul Fischer is a 32 y.o. male who presents to the Emergency Department complaining of shoulder pain.  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  Pain is located on the outer aspect of the shoulder and is worse with range of motion, resolves at rest.  He does have a history of labrum tear in the shoulder.  He tried ibuprofen at home with no significant change in symptoms.  No associated fever, chest pain, difficulty breathing.  He has no known medical problems.  He is right-hand dominant.  He works for [employer].     Home Medications Prior to Admission medications   Medication Sig Start Date End Date Taking? Authorizing Provider  ibuprofen (ADVIL) 800 MG tablet Take 1 tablet (800 mg total) by mouth every 8 (eight) hours as needed. 04/07/23  Yes Tilden Fossa, MD  amoxicillin-clavulanate (AUGMENTIN) 875-125 MG tablet Take 1 tablet by mouth 2 (two) times daily. One po bid x 7 days Patient not taking: Reported on 08/28/2022 10/07/20   Fayrene Helper, PA-C  methocarbamol (ROBAXIN) 500 MG tablet Take 1 tablet (500 mg total) by mouth 2 (two) times daily. Patient not taking: Reported on 08/28/2022 11/27/20   Mare Ferrari, PA-C  methylPREDNISolone (MEDROL DOSEPAK) 4 MG TBPK tablet Take as directed until finished Patient not taking: Reported on 08/28/2022 11/27/20   Trudee Grip A, PA-C  ondansetron (ZOFRAN-ODT) 4 MG disintegrating tablet Take 1 tablet (4 mg total) by mouth every 8 (eight) hours as needed for nausea. 08/28/22   Garlon Hatchet, PA-C  terbinafine (LAMISIL AT) 1 % cream Apply  1 application topically 2 (two) times daily. Patient not taking: Reported on 08/28/2022 10/07/20   Fayrene Helper, PA-C      Allergies    Patient has no known allergies.    Review of Systems   Review of Systems  All other systems reviewed and are negative.   Physical Exam Updated Vital Signs BP 128/74 (BP Location: Right Arm)   Pulse 67   Temp 97.7 F (36.5 C) (Oral)   Resp 18   Ht 6\' 1"  (1.854 m)   Wt 78.9 kg   SpO2 100%   BMI 22.95 kg/m  Physical Exam Vitals and nursing note reviewed.  Constitutional:      Appearance: He is well-developed.  HENT:     Head: Normocephalic and atraumatic.  Cardiovascular:     Rate and Rhythm: Normal rate and regular rhythm.     Heart sounds: No murmur heard. Pulmonary:     Effort: Pulmonary effort is normal. No respiratory distress.     Breath sounds: Normal breath sounds.  Musculoskeletal:     Comments: 2+ radial pulse in the left upper extremity.  There is mild tenderness to palpation over the outer aspect of the left shoulder without any erythema or effusion.  He is able to range the shoulder but does have pain on range of motion.  5 out of 5 grip strength in the left upper extremity.  Sensation to light touch intact in the  left upper extremity.  Skin:    General: Skin is warm and dry.  Neurological:     Mental Status: He is alert and oriented to person, place, and time.  Psychiatric:        Behavior: Behavior normal.     ED Results / Procedures / Treatments   Labs (all labs ordered are listed, but only abnormal results are displayed) Labs Reviewed - No data to display  EKG None  Radiology DG Shoulder Left  Result Date: 04/07/2023 CLINICAL DATA:  Left shoulder pain EXAM: LEFT SHOULDER - 2+ VIEW COMPARISON:  None Available. FINDINGS: No fracture or dislocation is seen. The joint spaces are preserved. Visualized soft tissues are within normal limits. Visualized left lung is clear. IMPRESSION: Negative. Electronically Signed   By:  Charline Bills M.D.   On: 04/07/2023 02:41    Procedures Procedures    Medications Ordered in ED Medications  ketorolac (TORADOL) injection 60 mg (has no administration in time range)  HYDROcodone-acetaminophen (NORCO/VICODIN) 5-325 MG per tablet 1 tablet (has no administration in time range)    ED Course/ Medical Decision Making/ A&P                                 Medical Decision Making Amount and/or Complexity of Data Reviewed Radiology: ordered.  Risk Prescription drug management.   Patient here for evaluation of left shoulder pain after activity.  Plain films are negative for acute fracture or abnormality-images personally reviewed and interpreted, agree with radiologist interpretation.  Examination is not consistent with septic or gouty arthritis, dissection, PE, pneumonia.  Discussed with patient home care for shoulder strain with activity as tolerated, no heavy lifting.  Will provide referral for orthopedics.  Discussed OTC analgesics, will prescribe 800 mg ibuprofen.        Final Clinical Impression(s) / ED Diagnoses Final diagnoses:  Strain of left shoulder, initial encounter    Rx / DC Orders ED Discharge Orders          Ordered    ibuprofen (ADVIL) 800 MG tablet  Every 8 hours PRN        04/07/23 0318              Tilden Fossa, MD 04/07/23 [phone]

## 2023-04-07 NOTE — ED Triage Notes (Signed)
Pt POV from home - has had shoulder pain for 2 days since he raked leaves but at work tonight could not even lift arm - pt has hx of small labrum tear.
